# Patient Record
Sex: Female | Born: 1965 | Race: Black or African American | Hispanic: No | Marital: Single | Smoking: Current every day smoker
Health system: Southern US, Community
[De-identification: ages and names within clinical notes are randomized; demographics above are authoritative.]

---

## 2000-02-08 ENCOUNTER — Encounter: Payer: Self-pay | Admitting: Emergency Medicine

## 2000-02-08 ENCOUNTER — Emergency Department (HOSPITAL_COMMUNITY): Admission: EM | Admit: 2000-02-08 | Discharge: 2000-02-08 | Payer: Self-pay | Admitting: Emergency Medicine

## 2005-12-22 ENCOUNTER — Observation Stay (HOSPITAL_COMMUNITY): Admission: EM | Admit: 2005-12-22 | Discharge: 2005-12-24 | Payer: Self-pay | Admitting: Emergency Medicine

## 2005-12-24 ENCOUNTER — Inpatient Hospital Stay (HOSPITAL_COMMUNITY): Admission: AD | Admit: 2005-12-24 | Discharge: 2005-12-27 | Payer: Self-pay | Admitting: Psychiatry

## 2005-12-24 ENCOUNTER — Ambulatory Visit: Payer: Self-pay | Admitting: Psychiatry

## 2006-06-20 ENCOUNTER — Ambulatory Visit: Payer: Self-pay | Admitting: Family Medicine

## 2006-07-15 ENCOUNTER — Ambulatory Visit: Payer: Self-pay | Admitting: Internal Medicine

## 2006-07-22 ENCOUNTER — Encounter: Admission: RE | Admit: 2006-07-22 | Discharge: 2006-07-22 | Payer: Self-pay | Admitting: Internal Medicine

## 2010-06-30 NOTE — Discharge Summary (Signed)
Priscilla Fisher, Priscilla Fisher               ACCOUNT NO.:  [id number]   MEDICAL RECORD NO.:  [id number]          PATIENT TYPE:  OBV   LOCATION:  2041                         FACILITY:  [facility]   PHYSICIAN:  Hind I Elsaid, MD      DATE OF BIRTH:  15-Oct-1965   DATE OF ADMISSION:  12/22/2005  DATE OF DISCHARGE:  12/24/2005                               DISCHARGE SUMMARY   PRIMARY CARE PHYSICIAN:  Unassigned.   DISCHARGE DIAGNOSES:  1. Overdose of Ativan in suicidal attempt.  2. Depression.   DISCHARGE MEDICATIONS:  None.   CONSULTATIONS:  Psych consult by Dr. Antonietta Breach  for a suicidal  attempt and depression.  The patient will be transferred to psych floor  for more adjustment of the psychiatric medications and for more  psychotherapy.   HOSPITAL COURSE:  The patient was admitted on December 22, 2005, with a  history of Ativan overdose and the patient has been following under  steady observation for her vital signs.  Was started on normal saline,  also the patient was started on IV fluid for hydration.  Continual  checking up of the pulse oximetry was continuous with not any sign of  respiratory depression or mental depression.  The patient was willing to  give a full history of what exactly happened during hospitalization,  during her further attempt.  So, the patient is medically cleared to be  transferred to a psych floor and to follow with Dr. Antonietta Breach.  The patient also denies any history of HIV and she is willing to do an  HIV test and counseling.   CONDITION ON DISCHARGE:  The patient was in good condition and is stable  to be discharged.  The patient is stable to be transferred to the psych  floor.   PHYSICAL EXAMINATION:  On discharge, the patient's vital signs:  Temperature is 98.6, pulse is 61, respiratory rate is 24, blood pressure  is 101/66, and she is saturating 94-100% on room air.  From a medical  standpoint of view, the patient is medically stable to transfer  to  psych.  The patient is also to follow with the primary care for further  testing.      Hind Bosie Helper, MD  Electronically Signed     HIE/MEDQ  D:  12/24/2005  T:  12/25/2005  Job:  [id number]

## 2010-06-30 NOTE — Discharge Summary (Signed)
NAMEGERI, HEPLER NO.:  [id number]   MEDICAL RECORD NO.:  [id number]          PATIENT TYPE:  IPS   LOCATION:  0302                          [facility]   PHYSICIAN:  Anselm Jungling, MD  DATE OF BIRTH:  08/25/1965   DATE OF ADMISSION:  12/24/2005  DATE OF DISCHARGE:  12/27/2005                                 DISCHARGE SUMMARY   IDENTIFYING DATA/REASON FOR ADMISSION:  This was the first [facility] admission for  [facility], a 45 year old single African American female who was transferred to  [facility] from [facility] where she had been treated for an overdose which  was a suicide attempt that occurred on or about December 21, 2005. The  patient gave [facility] a history of being alcohol dependent, but had quit alcohol  one year ago. She had not been involved in any current treatment for  depression. This was her first ever suicide attempt. The precipitant for  this apparently had been an argument with her girlfriend and significant  other, [facility]. The undersigned had seen the patient in psychiatric  consultation while the patient was still being treated for her overdose at  [facility] on December 23, 2005. Please refer to the admission note  for further details pertaining to the symptoms, circumstances, and history  that led to her hospitalization. She was given an initial Axis I diagnosis  of major depressive disorder and alcohol dependence in remission.   MEDICAL AND LABORATORY:  The patient was medically treated for her overdose  at [facility], and then upon admission to the psychiatric inpatient  service was medically and physically assessed by the psychiatric nurse  practitioner. She presented as a very slender but essentially healthy-  appearing woman without any significant active or chronic medical problems.  There were no acute medical issues.   [facility] COURSE:  The patient was admitted to the adult inpatient  psychiatric service. She presented  as a petite, thin African-American female  who was alert, fully oriented, well-groomed, up and active in the milieu.  She was articulate and open. Her thoughts and speech were normally  organized. There was nothing to suggest any underlying psychosis or thought  disorder. Her mood was moderately depressed with sad affect. She denied  suicidal ideation and indicated that she regretted her overdose which she  described as being something that she did to get her girlfriend's attention.  She was open to treatment for depression. We discussed a trial of  antidepressant medication and began Zoloft 25 mg daily. She participated in  various therapeutic groups and activities and was a good participant in the  milieu program.   On the second Fisher day there was a family counseling session involving  the patient and her significant other, Priscilla Fisher. They were able to discuss  issues between them calmly and thoughtfully and arrived at agreement on  returning to live together after [facility] is discharged.   By the fourth Fisher day the patient had been absent any suicidal ideation  and was feeling much better. She felt ready to return home and continue  treatment in the outpatient setting.   AFTERCARE:  The patient was to follow up with Dr. Joni Reining at the [facility] on January 01, 2006, and was also to present at [facility] of  the [facility] for individual counseling.   DISCHARGE MEDICATIONS:  Zoloft 50 mg daily.   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   AXIS II:  Deferred.   AXIS III:  No acute or chronic illnesses.   AXIS IV:  Stressors severe.   AXIS V:  GAF on discharge 70.      Anselm Jungling, MD  Electronically Signed     SPB/MEDQ  D:  12/28/2005  T:  12/28/2005  Job:  [id number]

## 2010-06-30 NOTE — H&P (Signed)
NAMEEXIE, CHRISMER NO.:  [id number]   MEDICAL RECORD NO.:  [id number]          PATIENT TYPE:  EMS   LOCATION:  MAJO                         FACILITY:  [facility]   PHYSICIAN:  Theresia Bough, MD       DATE OF BIRTH:  06/30/1965   DATE OF ADMISSION:  12/22/2005  DATE OF DISCHARGE:                                HISTORY & PHYSICAL   PRESENTING COMPLAINT:  Patient was brought into the hospital with a  complaint of overdose.   HISTORY OF PRESENT ILLNESS:  This is a 45 year old African-American female  patient who came into the hospital today because she took an overdose of  Ativan.  Patient took an unknown amount of Ativan pills.  She took it at  about 9:00 a.m. today.  This was a suicide attempt.  At the time of this  examination, patient has been sleeping.  She was not able to give me any  good history.  I relied on the history recorded by the ED physician; I also  relied on the nurse's record as well as the lab and chest x-ray findings.  The reason for the ingestion was a suicide attempt.  Patient was said to  have no significant past medical history.  Patient is not able to answer to  any review of systems at this time.   PAST MEDICAL HISTORY:  Not significant.   SOCIAL HISTORY:  Patient is a nonsmoker.  I am not sure if she drinks  alcohol.  I do not know if she has a history of drug abuse.   FAMILY HISTORY:  Not obtainable at this time.   HOME MEDICATIONS:  Patient was not on any medications.  Patient has no known  drug allergies.   REVIEW OF SYSTEMS:  Patient's review of systems documented by the ED was  negative.   PHYSICAL EXAMINATION:  VITAL SIGNS:  Blood pressure 107/73 with a pulse rate  of 74, respirations of 18, temperature of 95.9.  HEAD/NECK:  Show pink conjunctivae.  She has no jaundice.  Neck is supple.  Her mucous membrane is moist.  CHEST:  Clear to auscultation bilaterally.  No crackles.  There are no  rales.  There is no wheezing.  CARDIOVASCULAR:  First and second S sounds, no murmur, no gallop and no rub.  ABDOMEN:  Soft and nontender, patient has normal bowel sounds.  There are no  masses palpable.  EXTREMITIES:  Show no edema.  No deformity.  CENTRAL NERVOUS SYSTEM:  Patient is arousable and responds to __________, a  little drowsy at this time and she went back to sleep after I tried to wake  her up.  SKIN:  Her skin color is normal, there are no rashes.   Initial chest x-ray shows no acute abnormalities.   Initial EKG shows sinus bradycardia, no active STT changes.   Sodium level is 139, potassium 4.0, chloride of 105, bicarb of 25, BUN of  18, creatinine of 0.7 and glucose of 85.  WBC 11.4 which is slightly  elevated, hemoglobin 14.1, hematocrit 42.7, platelets of 319.  Drug screen  is positive for benzodiazepine.  Drug screen is also positive for  tetrahydrocannabinoid.  Urinalysis shows no infection.  Tylenol level is  less than 10, INR is 1.0, PT is __________, alcohol is less than 5,  salicylate is less than 4 which are all normal.   ASSESSMENT:  1. This was a benzodiazepine overdose.  2. Possible suicide intention.   Our plan is to admit the patient to telemetry observation for monitoring.  Patient is going to have IV fluids with normal saline at 70 cc per hour and  then we are going to check her pulse oximetry continuously.  She is going to  be a fall precaution.  Patient is going to be seen by behavioral health in  a.m. and also she is going to have substance abuse counseling because of the  tetrahydrocannabinoid that was seen in her urine drug screen.  Further  management of this patient will depend on any clinical symptoms that may  develop.  At this time, patient is sleeping.  She is saturating well with an  oxygen saturation about 95-to-99% on room air.  She will be on telemetry  monitoring.      Theresia Bough, MD  Electronically Signed     GA/MEDQ  D:  12/22/2005  T:  12/23/2005   Job:  [id number]

## 2013-10-31 ENCOUNTER — Emergency Department (HOSPITAL_COMMUNITY)
Admission: EM | Admit: 2013-10-31 | Discharge: 2013-10-31 | Disposition: A | Discharge status: Home / Self Care | Payer: No Typology Code available for payment source | Attending: Emergency Medicine | Admitting: Emergency Medicine

## 2013-10-31 ENCOUNTER — Encounter (HOSPITAL_COMMUNITY): Payer: Self-pay | Admitting: Emergency Medicine

## 2013-10-31 ENCOUNTER — Emergency Department (HOSPITAL_COMMUNITY): Payer: No Typology Code available for payment source

## 2013-10-31 DIAGNOSIS — IMO0002 Reserved for concepts with insufficient information to code with codable children: Secondary | ICD-10-CM | POA: Insufficient documentation

## 2013-10-31 DIAGNOSIS — Z880 Allergy status to penicillin: Secondary | ICD-10-CM | POA: Diagnosis not present

## 2013-10-31 DIAGNOSIS — Z7982 Long term (current) use of aspirin: Secondary | ICD-10-CM | POA: Diagnosis not present

## 2013-10-31 DIAGNOSIS — Y92410 Unspecified street and highway as the place of occurrence of the external cause: Secondary | ICD-10-CM | POA: Diagnosis not present

## 2013-10-31 DIAGNOSIS — F17200 Nicotine dependence, unspecified, uncomplicated: Secondary | ICD-10-CM | POA: Diagnosis not present

## 2013-10-31 DIAGNOSIS — Y9389 Activity, other specified: Secondary | ICD-10-CM | POA: Diagnosis not present

## 2013-10-31 DIAGNOSIS — M479 Spondylosis, unspecified: Secondary | ICD-10-CM

## 2013-10-31 MED ORDER — HYDROCODONE-ACETAMINOPHEN 5-325 MG PO TABS: 2.0000 | ORAL_TABLET | ORAL | Status: DC | PRN

## 2013-10-31 MED ORDER — IBUPROFEN 600 MG PO TABS: 600.0000 mg | ORAL_TABLET | Freq: Four times a day (QID) | ORAL | Status: DC | PRN

## 2013-10-31 MED ORDER — CYCLOBENZAPRINE HCL 10 MG PO TABS: 10.0000 mg | ORAL_TABLET | Freq: Two times a day (BID) | ORAL | Status: DC | PRN
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
## 2013-10-31 NOTE — Discharge Instructions (Signed)
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
## 2015-07-23 ENCOUNTER — Emergency Department (HOSPITAL_COMMUNITY)
Admission: EM | Admit: 2015-07-23 | Discharge: 2015-07-24 | Disposition: A | Discharge status: Other Acute Inpatient Hospital | Payer: No Typology Code available for payment source | Attending: Emergency Medicine | Admitting: Emergency Medicine

## 2015-07-23 ENCOUNTER — Encounter (HOSPITAL_COMMUNITY): Payer: Self-pay | Admitting: Emergency Medicine

## 2015-07-23 DIAGNOSIS — F17210 Nicotine dependence, cigarettes, uncomplicated: Secondary | ICD-10-CM | POA: Diagnosis present

## 2015-07-23 DIAGNOSIS — G4700 Insomnia, unspecified: Secondary | ICD-10-CM | POA: Diagnosis present

## 2015-07-23 DIAGNOSIS — X76XXXA Intentional self-harm by smoke, fire and flames, initial encounter: Secondary | ICD-10-CM | POA: Insufficient documentation

## 2015-07-23 DIAGNOSIS — F333 Major depressive disorder, recurrent, severe with psychotic symptoms: Principal | ICD-10-CM | POA: Diagnosis present

## 2015-07-23 DIAGNOSIS — T23151A Burn of first degree of right palm, initial encounter: Secondary | ICD-10-CM | POA: Insufficient documentation

## 2015-07-23 DIAGNOSIS — T2016XA Burn of first degree of forehead and cheek, initial encounter: Secondary | ICD-10-CM | POA: Insufficient documentation

## 2015-07-23 DIAGNOSIS — Y999 Unspecified external cause status: Secondary | ICD-10-CM | POA: Insufficient documentation

## 2015-07-23 DIAGNOSIS — R440 Auditory hallucinations: Secondary | ICD-10-CM | POA: Insufficient documentation

## 2015-07-23 DIAGNOSIS — F29 Unspecified psychosis not due to a substance or known physiological condition: Secondary | ICD-10-CM | POA: Diagnosis present

## 2015-07-23 DIAGNOSIS — Y929 Unspecified place or not applicable: Secondary | ICD-10-CM | POA: Insufficient documentation

## 2015-07-23 DIAGNOSIS — Y939 Activity, unspecified: Secondary | ICD-10-CM | POA: Insufficient documentation

## 2015-07-23 DIAGNOSIS — R45851 Suicidal ideations: Secondary | ICD-10-CM

## 2015-07-23 DIAGNOSIS — F17200 Nicotine dependence, unspecified, uncomplicated: Secondary | ICD-10-CM | POA: Insufficient documentation

## 2015-07-23 DIAGNOSIS — F1210 Cannabis abuse, uncomplicated: Secondary | ICD-10-CM | POA: Diagnosis present

## 2015-07-23 DIAGNOSIS — F411 Generalized anxiety disorder: Secondary | ICD-10-CM | POA: Diagnosis present

## 2015-07-23 LAB — COMPREHENSIVE METABOLIC PANEL
ALBUMIN: 4.6 g/dL (ref 3.5–5.0)
ALK PHOS: 84 U/L (ref 38–126)
ALT: 23 U/L (ref 14–54)
AST: 37 U/L (ref 15–41)
Anion gap: 9 (ref 5–15)
BUN: 9 mg/dL (ref 6–20)
CALCIUM: 9.8 mg/dL (ref 8.9–10.3)
CO2: 26 mmol/L (ref 22–32)
Chloride: 107 mmol/L (ref 101–111)
Creatinine, Ser: 0.77 mg/dL (ref 0.44–1.00)
Glucose, Bld: 119 mg/dL — ABNORMAL HIGH (ref 65–99)
Potassium: 3.1 mmol/L — ABNORMAL LOW (ref 3.5–5.1)
Sodium: 142 mmol/L (ref 135–145)
TOTAL PROTEIN: 7.9 g/dL (ref 6.5–8.1)
Total Bilirubin: 0.7 mg/dL (ref 0.3–1.2)

## 2015-07-23 LAB — URINE MICROSCOPIC-ADD ON

## 2015-07-23 LAB — RAPID URINE DRUG SCREEN, HOSP PERFORMED
AMPHETAMINES: NOT DETECTED
BENZODIAZEPINES: NOT DETECTED
Barbiturates: NOT DETECTED
Cocaine: NOT DETECTED
Opiates: NOT DETECTED
TETRAHYDROCANNABINOL: POSITIVE — AB

## 2015-07-23 LAB — URINALYSIS, ROUTINE W REFLEX MICROSCOPIC
BILIRUBIN URINE: NEGATIVE
Glucose, UA: NEGATIVE mg/dL
KETONES UR: NEGATIVE mg/dL
NITRITE: NEGATIVE
PH: 5.5 (ref 5.0–8.0)
Protein, ur: NEGATIVE mg/dL
SPECIFIC GRAVITY, URINE: 1.034 — AB (ref 1.005–1.030)

## 2015-07-23 LAB — CBC
HCT: 41.3 % (ref 36.0–46.0)
Hemoglobin: 13.8 g/dL (ref 12.0–15.0)
MCH: 28.1 pg (ref 26.0–34.0)
MCHC: 33.4 g/dL (ref 30.0–36.0)
MCV: 84.1 fL (ref 78.0–100.0)
Platelets: 329 10*3/uL (ref 150–400)
RBC: 4.91 MIL/uL (ref 3.87–5.11)
RDW: 14.3 % (ref 11.5–15.5)
WBC: 14 10*3/uL — ABNORMAL HIGH (ref 4.0–10.5)

## 2015-07-23 LAB — SALICYLATE LEVEL

## 2015-07-23 LAB — ACETAMINOPHEN LEVEL: Acetaminophen (Tylenol), Serum: <10 ug/mL — ABNORMAL LOW (ref 10–30)

## 2015-07-23 LAB — ETHANOL

## 2015-07-23 MED ORDER — TRAZODONE HCL 50 MG PO TABS
50.0000 mg | ORAL_TABLET | Freq: Every day | ORAL | Status: DC
  Filled 2015-07-23: qty 1
  Filled 2015-07-23: qty 7
  Filled 2015-07-23 (×4): qty 1
  Filled 2015-07-23: qty 7

## 2015-07-23 MED ORDER — LORAZEPAM 1 MG PO TABS: 1.0000 mg | ORAL_TABLET | Freq: Three times a day (TID) | ORAL | Status: DC | PRN

## 2015-07-23 MED ORDER — TRAZODONE HCL 50 MG PO TABS: 50.0000 mg | ORAL_TABLET | Freq: Every evening | ORAL | Status: DC | PRN

## 2015-07-23 MED ORDER — MAGNESIUM HYDROXIDE 400 MG/5ML PO SUSP
30.0000 mL | Freq: Every day | ORAL | Status: DC | PRN
  Filled 2015-07-23: qty 30

## 2015-07-23 MED ORDER — OLANZAPINE 2.5 MG PO TABS
2.5000 mg | ORAL_TABLET | Freq: Every day | ORAL | Status: DC
  Administered 2015-07-23: 2.5 mg via ORAL
  Filled 2015-07-23: qty 1

## 2015-07-23 MED ORDER — ZOLPIDEM TARTRATE 5 MG PO TABS: 5.0000 mg | ORAL_TABLET | Freq: Every evening | ORAL | Status: DC | PRN

## 2015-07-23 MED ORDER — ALUM & MAG HYDROXIDE-SIMETH 200-200-20 MG/5ML PO SUSP: 30.0000 mL | ORAL | Status: DC | PRN

## 2015-07-23 MED ORDER — LORAZEPAM 1 MG PO TABS: 0.0000 mg | ORAL_TABLET | Freq: Two times a day (BID) | ORAL | Status: DC

## 2015-07-23 MED ORDER — POTASSIUM CHLORIDE CRYS ER 20 MEQ PO TBCR
40.0000 meq | EXTENDED_RELEASE_TABLET | Freq: Once | ORAL | Status: AC
  Administered 2015-07-23: 40 meq via ORAL
  Filled 2015-07-23: qty 2

## 2015-07-23 MED ORDER — LORAZEPAM 1 MG PO TABS: 0.0000 mg | ORAL_TABLET | Freq: Four times a day (QID) | ORAL | Status: DC

## 2015-07-23 MED ORDER — VITAMIN B-1 100 MG PO TABS
100.0000 mg | ORAL_TABLET | Freq: Every day | ORAL | Status: DC
  Administered 2015-07-23: 100 mg via ORAL
  Filled 2015-07-23: qty 1

## 2015-07-23 MED ORDER — ALUM & MAG HYDROXIDE-SIMETH 200-200-20 MG/5ML PO SUSP
30.0000 mL | ORAL | Status: DC | PRN
  Filled 2015-07-23: qty 30

## 2015-07-23 MED ORDER — ONDANSETRON HCL 4 MG PO TABS: 4.0000 mg | ORAL_TABLET | Freq: Three times a day (TID) | ORAL | Status: DC | PRN

## 2015-07-23 MED ORDER — ACETAMINOPHEN 325 MG PO TABS
650.0000 mg | ORAL_TABLET | Freq: Four times a day (QID) | ORAL | Status: DC | PRN
  Filled 2015-07-23: qty 2

## 2015-07-23 MED ORDER — THIAMINE HCL 100 MG/ML IJ SOLN: 100.0000 mg | Freq: Every day | INTRAMUSCULAR | Status: DC

## 2015-07-23 MED ORDER — ACETAMINOPHEN 325 MG PO TABS: 650.0000 mg | ORAL_TABLET | ORAL | Status: DC | PRN

## 2015-07-23 MED ORDER — NICOTINE 21 MG/24HR TD PT24: 21.0000 mg | MEDICATED_PATCH | Freq: Every day | TRANSDERMAL | Status: DC

## 2015-07-23 MED ORDER — IBUPROFEN 200 MG PO TABS: 600.0000 mg | ORAL_TABLET | Freq: Three times a day (TID) | ORAL | Status: DC | PRN

## 2015-07-23 NOTE — Progress Notes (Signed)
[facility]  per Dr. Abelina BachelorEppen.  Call report to [phone].  Call for admission ETA after shift change.      Maryelizabeth Rowanressa Inita Uram, MSW, Clare CharonLCSW, LCAS [facility] Triage Specialist [phone]
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
## 2015-07-23 NOTE — ED Notes (Signed)
Pair of shoes, white socks, blue jeans, white shirt, key, pair of earrings, White sports bra

## 2015-07-23 NOTE — ED Notes (Signed)
Patient noted in room talking to NP. No complaints, stable, in no acute distress. Q15 minute rounds and monitoring via [employer] to continue.

## 2015-07-23 NOTE — ED Notes (Signed)
First attempt made to try and call report. Nurse currently busy, she requested I try again between half and hour and an hour from now.

## 2015-07-23 NOTE — ED Notes (Signed)
Up tot he bathroom to shower and change scrubs 

## 2015-07-23 NOTE — ED Notes (Signed)
Report from Janie Rambo RN. Patient sleeping, respirations regular and unlabored. Q15 minute rounds and security camera observation to continue.   

## 2015-07-23 NOTE — ED Notes (Addendum)
Pt reports that she drinks 2 beers a day and denies any s/s of withdrawal at this time, and has not had any in the previously.

## 2015-07-23 NOTE — [facility] Counselor (Signed)
[facility] and can be transported once medically cleared. Please call report before sending- report number for adult unit [id number].   Kateri PlummerKristin Malanie Koloski, M.S., LPCA, [place], [facility] Licensed Professional Counselor Associate  Triage Specialist  [facility]  Therapeutic Triage Services Phone: [phone] Fax: [phone]
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
## 2015-07-23 NOTE — ED Notes (Signed)
Patient noted sleeping in room. No complaints, stable, in no acute distress. Q15 minute rounds and monitoring via Security Cameras to continue.  

## 2015-07-24 ENCOUNTER — Encounter (HOSPITAL_COMMUNITY): Payer: Self-pay

## 2015-07-24 ENCOUNTER — Inpatient Hospital Stay (HOSPITAL_COMMUNITY)
Admission: AD | Admit: 2015-07-24 | Discharge: 2015-07-25 | DRG: 885 | Disposition: A | Discharge status: Home / Self Care | Payer: No Typology Code available for payment source | Attending: Psychiatry | Admitting: Psychiatry

## 2015-07-24 DIAGNOSIS — F333 Major depressive disorder, recurrent, severe with psychotic symptoms: Secondary | ICD-10-CM

## 2015-07-24 DIAGNOSIS — F411 Generalized anxiety disorder: Secondary | ICD-10-CM | POA: Diagnosis present

## 2015-07-24 DIAGNOSIS — F323 Major depressive disorder, single episode, severe with psychotic features: Secondary | ICD-10-CM | POA: Clinically undetermined

## 2015-07-24 DIAGNOSIS — F29 Unspecified psychosis not due to a substance or known physiological condition: Secondary | ICD-10-CM | POA: Diagnosis present

## 2015-07-24 DIAGNOSIS — G4700 Insomnia, unspecified: Secondary | ICD-10-CM | POA: Diagnosis present

## 2015-07-24 DIAGNOSIS — F17210 Nicotine dependence, cigarettes, uncomplicated: Secondary | ICD-10-CM | POA: Diagnosis present

## 2015-07-24 DIAGNOSIS — F1210 Cannabis abuse, uncomplicated: Secondary | ICD-10-CM | POA: Clinically undetermined

## 2015-07-24 LAB — LIPID PANEL
CHOL/HDL RATIO: 3.1 ratio
CHOLESTEROL: 200 mg/dL (ref 0–200)
HDL: 64 mg/dL (ref >40)
LDL CALC: 101 mg/dL — AB (ref 0–99)
TRIGLYCERIDES: 176 mg/dL — AB (ref <150)
VLDL: 35 mg/dL (ref 0–40)

## 2015-07-24 LAB — TSH: TSH: 1.767 u[IU]/mL (ref 0.350–4.500)

## 2015-07-24 LAB — PREGNANCY, URINE: Preg Test, Ur: NEGATIVE

## 2015-07-24 MED ORDER — HYDROXYZINE HCL 25 MG PO TABS
25.0000 mg | ORAL_TABLET | ORAL | Status: DC | PRN
  Filled 2015-07-24: qty 10
  Filled 2015-07-24 (×2): qty 1

## 2015-07-24 MED ORDER — NICOTINE 21 MG/24HR TD PT24
21.0000 mg | MEDICATED_PATCH | Freq: Every day | TRANSDERMAL | Status: DC
  Administered 2015-07-25: 21 mg via TRANSDERMAL
  Filled 2015-07-24 (×4): qty 1

## 2015-07-24 MED ORDER — ARIPIPRAZOLE 5 MG PO TABS
5.0000 mg | ORAL_TABLET | Freq: Every day | ORAL | Status: DC
  Administered 2015-07-24 – 2015-07-25 (×2): 5 mg via ORAL
  Filled 2015-07-24: qty 1
  Filled 2015-07-24: qty 7
  Filled 2015-07-24 (×3): qty 1
  Filled 2015-07-24: qty 7

## 2015-07-24 NOTE — Tx Team (Signed)
Initial Interdisciplinary Treatment Plan   PATIENT STRESSORS: Financial difficulties Health problems   PATIENT STRENGTHS: Ability for insight Active sense of humor Average or above average intelligence General fund of knowledge Motivation for treatment/growth   PROBLEM LIST: Problem List/Patient Goals Date to be addressed Date deferred Reason deferred Estimated date of resolution  depression 07/24/2015     anxiety 07/24/2015     Self harm behaviour                         07/24/2015     "get some sleep" 07/24/2015     Risk for suicide 07/24/2015     Feeling overworked 07/24/2015                        DISCHARGE CRITERIA:  Ability to meet basic life and health needs Improved stabilization in mood, thinking, and/or behavior Motivation to continue treatment in a less acute level of care Need for constant or close observation no longer present Verbal commitment to aftercare and medication compliance  PRELIMINARY DISCHARGE PLAN: Attend aftercare/continuing care group Return to previous work or school arrangements  PATIENT/FAMIILY INVOLVEMENT: This treatment plan has been presented to and reviewed with the patient, Talmadge ChadGloria M Terriquez, and The patient and family have been given the opportunity to ask questions and make suggestions.  JEHU-APPIAH, Calle Schader K 07/24/2015, 2:31 AM
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
## 2015-07-24 NOTE — Progress Notes (Signed)
Nutrition Brief Note  Patient identified on the Malnutrition Screening Tool (MST) Report  Wt Readings from Last 15 Encounters:  07/24/15 133 lb (60.328 kg)    Body mass index is 20.23 kg/(m^2). Patient meets criteria for normal range based on current BMI.   Diet Order: Diet regular Room service appropriate?: Yes; Fluid consistency:: Thin Pt is also offered choice of unit snacks mid-morning and mid-afternoon.   Labs and medications reviewed.   No nutrition interventions warranted at this time. If nutrition issues arise, please consult RD.   Priscilla FrancoLindsey Yaslyn Cumby, MS, RD, LDN Pager: [phone] After Hours Pager: [phone]

## 2015-07-24 NOTE — H&P (Signed)
Psychiatric Admission Assessment Adult  Patient Identification: Priscilla Fisher  MRN:  [id number]  Date of Evaluation:  07/24/2015  Chief Complaint:  MDD with psychotic features  Principal Diagnosis: Severe recurrent major depressive disorder with psychotic features [facility](HCC)  Diagnosis:   Patient Active Problem List   Diagnosis Date Noted  . Psychosis [F29] 07/23/2015   History of Present Illness: This is an admission assessment for Priscilla Fisher, a 50 year old African-American female. [facility]. She came to the hospital with a complaint of having auditory hallucinations that were commanding in nature, telling her to do things. Apparently, this has gone x 2 weeks. During this admission assessment, Priscilla Fisher reports, "I'm not well. I came to the hospital because there is something wrong with me. There is something going on with me that I do not understand. I'm stressed from alright. I work too much. The stress is from work. I have so much to do at work. Something is definitely wrong with me & I'm scared to say it. It has been going for a while, may 2 weeks or more. There is a Ambulance persondemon in me. I think I did something really bad a couple of days ago. I cannot say what it is, I'm sorry.  I need a preacher, yes, that is what I need. I have been treated for depression a long time ago with Zoloft. I did not do well on Zoloft because, it made me mean".  Objective: Priscilla Fisher is seen, chart reviewed. She is alert alert, oriented x 3 & aware of situation. When entering her room to do this assessment, Priscilla Fisher was observed sitting quietly on her bed facing the wall while backing the door. She presents with a restricted affect, barely making eye contact. She is verbally responsive, but not spontaneous. She appears to be responding to some internal stimuli. She presents as guarded or could be thought blocking. She did admit to something being wrong with her,  however, seem uncomfortable or rather scared to say what it. She did make a comment that she did not want to say what she believed was wrong with her or that she did wrong for fear of being locked up. She requested to see a preacher. She is reassured & encouraged to participate in the group milieu.  Associated Signs/Symptoms:  Depression Symptoms:  insomnia, feelings of worthlessness/guilt, difficulty concentrating, anxiety, Concerns  (Hypo) Manic Symptoms:  Hallucinations,  Anxiety Symptoms:  Excessive Worry,  Psychotic Symptoms:  Hallucinations: Auditory  PTSD Symptoms: Denies any symptoms  Total Time spent with patient: 1 hour  Past Psychiatric History: Major depression.  Is the patient at risk to self? Yes.    Has the patient been a risk to self in the past 6 months? No.  Has the patient been a risk to self within the distant past? No.  Is the patient a risk to others? No.  Has the patient been a risk to others in the past 6 months? No.  Has the patient been a risk to others within the distant past? No.   Prior Inpatient Therapy: Prior Inpatient Therapy: Yes Prior Therapy Dates: 2007 [facility] Reason for Treatment: SI attempt Prior Outpatient Therapy: Prior Outpatient Therapy: No Does patient have an ACCT team?: No Does patient have Intensive In-House Services?  : No Does patient have [facility] services? : No Does patient have P4CC services?: No  Alcohol Screening: 1. How often do you  have a drink containing alcohol?: 2 to 4 times a month 2. How many drinks containing alcohol do you have on a typical day when you are drinking?: 1 or 2 3. How often do you have six or more drinks on one occasion?: Never Preliminary Score: 0 9. Have you or someone else been injured as a result of your drinking?: No 10. Has a relative or friend or a doctor or another health worker been concerned about your drinking or suggested you cut down?: No Alcohol Use  Disorder Identification Test Final Score (AUDIT): 2 Brief Intervention: AUDIT score less than 7 or less-screening does not suggest unhealthy drinking-brief intervention not indicated  Substance Abuse History in the last 12 months:  Yes.    Consequences of Substance Abuse: Medical Consequences:  Liver damage, Possible death by overdose Legal Consequences:  Arrests, jail time, Loss of driving privilege. Family Consequences:  Family discord, divorce and or separation.  Previous Psychotropic Medications: Yes, Sertraline  Psychological Evaluations: Yes   Past Medical History: History reviewed. No pertinent past medical history. History reviewed. No pertinent past surgical history.  Family History: History reviewed. No pertinent family history.  Family Psychiatric  History: "I don't know, I was adopted"  Tobacco Screening: Smokes a pack of cigarettes daily.  Social History:  History  Alcohol Use  . 1.2 oz/week  . 2 Cans of beer per week    Comment: occasional     History  Drug Use  . Yes  . Special: Marijuana    Additional Social History: Marital status: Single    History of alcohol / drug use?: Yes Name of Substance 1: Marijuana  1 - Age of First Use: Unspecified 1 - Amount (size/oz): uses small amounts throughout the week to "unwind" 1 - Last Use / Amount: last use 2 weeks ago  Name of Substance 2: Social drinker of alcohol- no abuse specified   Allergies:   Allergies  Allergen Reactions  . Penicillins Other (See Comments)    Unknown Has patient had a PCN reaction causing immediate rash, facial/tongue/throat swelling, SOB or lightheadedness with hypotension: Unknown Has patient had a PCN reaction causing severe rash involving mucus membranes or skin necrosis: Unknown Has patient had a PCN reaction that required hospitalization Unknown Has patient had a PCN reaction occurring within the last 10 years: No If all of the above answers are "NO", then may proceed with  Cephalosporin use.    Lab Results:  Results for orders placed or performed during the hospital encounter of 07/24/15 (from the past 48 hour(s))  TSH     Status: None   Collection Time: 07/24/15  6:26 AM  Result Value Ref Range   TSH 1.767 0.350 - 4.500 uIU/mL    Comment: Performed at [facility]   Blood Alcohol level:  Lab Results  Component Value Date   [facility] <5 07/23/2015   Metabolic Disorder Labs:  No results found for: HGBA1C, MPG No results found for: PROLACTIN No results found for: CHOL, TRIG, HDL, CHOLHDL, VLDL, LDLCALC  Current Medications: Current Facility-Administered Medications  Medication Dose Route Frequency Provider Last Rate Last Dose  . acetaminophen (TYLENOL) tablet 650 mg  650 mg Oral Q6H PRN Sanjuana Kava, NP      . alum & mag hydroxide-simeth (MAALOX/MYLANTA) 200-200-20 MG/5ML suspension 30 mL  30 mL Oral Q4H PRN Sanjuana Kava, NP      . magnesium hydroxide (MILK OF MAGNESIA) suspension 30 mL  30 mL Oral Daily PRN  Sanjuana Kava, NP      . traZODone (DESYREL) tablet 50 mg  50 mg Oral QHS Sanjuana Kava, NP   50 mg at 07/24/15 0200   PTA Medications: No prescriptions prior to admission   Musculoskeletal: Strength & Muscle Tone: within normal limits Gait & Station: normal Patient leans: N/A  Psychiatric Specialty Exam: Physical Exam  Constitutional: She is oriented to person, place, and time. She appears well-developed and well-nourished.  HENT:  Head: Normocephalic.  Eyes: Pupils are equal, round, and reactive to light.  Neck: Normal range of motion.  Cardiovascular: Normal rate.   Respiratory: Effort normal.  GI: Soft.  Genitourinary:  Denies any issues in this area  Musculoskeletal: Normal range of motion.  Neurological: She is alert and oriented to person, place, and time.  Skin: Skin is warm and dry.  Psychiatric: Her speech is normal. Her mood appears anxious. Her affect is not angry, not blunt, not labile and not  inappropriate. She is actively hallucinating (Auditory). Thought content is delusional. Cognition and memory are normal. She expresses impulsivity. She exhibits a depressed mood.    Review of Systems  Constitutional: Negative.   HENT: Negative.   Eyes: Negative.   Respiratory: Negative.   Cardiovascular: Negative.   Gastrointestinal: Negative.   Genitourinary: Negative.   Musculoskeletal: Negative.   Skin: Negative.   Neurological: Negative.   Endo/Heme/Allergies: Negative.   Psychiatric/Behavioral: Positive for depression, hallucinations (Auditory hallucination) and substance abuse (Cannabis abuse). Negative for memory loss. The patient is nervous/anxious and has insomnia.     Blood pressure 115/78, pulse 69, temperature 98 F (36.7 C), temperature source Oral, resp. rate 18, height 5\' 8"  (1.727 m), weight 60.328 kg (133 lb).Body mass index is 20.23 kg/(m^2).  General Appearance: Casual, guarded, almost secretive  Eye Contact:  Fair  Speech:  Clear, coherent, not spontaneous  Volume:  Decreased  Mood:  Anxious and Concern  Affect:  Congruent and Restricted  Thought Process:  Coherent  Orientation:  Full (Time, Place, and Person)  Thought Content:  Hallucinations: Auditory  Suicidal Thoughts:  Denies  Homicidal Thoughts:  Denies  Memory:  Immediate;   Good Recent;   Good Remote;   Good  Judgement:  Fair  Insight:  Fair  Psychomotor Activity:  Decreased  Concentration:  Concentration: Fair and Attention Span: Fair  Recall:  Good  Fund of Knowledge:  Fair  Language:  Good  Akathisia:  Negative  Handed:  Right  AIMS (if indicated):     Assets:  Communication Skills Desire for Improvement Physical Health  ADL's:  Intact  Cognition:  WNL  Sleep:      Treatment Plan/Recommendations: 1. Admit for crisis management and stabilization, estimated length of stay 3-5 days.  2. Medication management to reduce current symptoms to base line and improve the patient's overall level of  functioning: Abilify 5 mg for mood control, Trazodone 50 mg for insomnia, Hydroxyzine 25 mg for depression.  3. Treat health problems as indicated.  4. Develop treatment plan to decrease risk of relapse upon discharge and the need for readmission.  5. Psycho-social education regarding relapse prevention and self care.  6. Health care follow up as needed for medical problems.  7. Review, reconcile, and reinstate any pertinent home medications for other health issues where appropriate. 8. Call for consults with hospitalist for any additional specialty patient care services as needed.  Observation Level/Precautions:  15 minute checks  Laboratory:  Per ED, UDS positive for [facility]  Psychotherapy: Group  sessions    Medications: Abilify 5 mg for mood control, Trazodone 50 mg for insomnia, Hydroxyzine 25 mg for depression.   Consultations: As needed  Discharge Concerns: Safety, mood stability  Estimated LOS: 3-5 days  Other: Admit to 400-hall   I certify that inpatient services furnished can reasonably be expected to improve the patient's condition.    Sanjuana Kava, NP, PMHNP, FNP-BC 6/11/20179:19 AM  Patient seen face-to-face for this evaluation, case discussed with the treatment team, physician extender, completed admission suicide risk assessment and made treatment plan. Reviewed the information documented and agree with the treatment plan.   Dondre Catalfamo [facility] 07/24/2015 3:35 PM

## 2015-07-24 NOTE — [facility] Suicide Risk Assessment (Signed)
[facility] Admission Suicide Risk Assessment   Nursing information obtained from:  Patient, Review of record Demographic factors:  Living alone Current Mental Status:  Self-harm thoughts Loss Factors:  Financial problems / change in socioeconomic status Historical Factors:  NA Risk Reduction Factors:  Employed, Positive social support  Total Time spent with patient: 1 hour Principal Problem: Severe recurrent major depressive disorder with psychotic features (HCC) Diagnosis:   Patient Active Problem List   Diagnosis Date Noted  . Severe recurrent major depressive disorder with psychotic features (HCC) [F33.3] 07/24/2015  . Psychosis [F29] 07/23/2015   Subjective Data: Patient with increased depression with psychosis and unable to sleep and has bizarre behaviors. She needs crisis management and safety monitoring.   Continued Clinical Symptoms:  Alcohol Use Disorder Identification Test Final Score (AUDIT): 2 The "Alcohol Use Disorders Identification Test", Guidelines for Use in Primary Care, Second Edition.  World Science writer [facility]). Score between 0-7:  no or low risk or alcohol related problems. Score between 8-15:  moderate risk of alcohol related problems. Score between 16-19:  high risk of alcohol related problems. Score 20 or above:  warrants further diagnostic evaluation for alcohol dependence and treatment.   CLINICAL FACTORS:   Severe Anxiety and/or Agitation Bipolar Disorder:   Depressive phase Depression:   Comorbid alcohol abuse/dependence Delusional Hopelessness Impulsivity Insomnia Recent sense of peace/wellbeing Severe Alcohol/Substance Abuse/Dependencies More than one psychiatric diagnosis Currently Psychotic Previous Psychiatric Diagnoses and Treatments   Musculoskeletal: Strength & Muscle Tone: within normal limits Gait & Station: normal Patient leans: Right  Psychiatric Specialty Exam: Physical Exam Full physical performed in Emergency Department. I  have reviewed this assessment and concur with its findings.   ROS depression, hallucinations, bizarre behaviors, decreased need for sleep and increased goal directed activity. Concern about safety and paranoid delusions.  No Fever-chills, No Headache, No changes with Vision or hearing, reports vertigo No problems swallowing food or Liquids, No Chest pain, Cough or Shortness of Breath, No Abdominal pain, No Nausea or Vommitting, Bowel movements are regular, No Blood in stool or Urine, No dysuria, No new skin rashes or bruises, No new joints pains-aches,  No new weakness, tingling, numbness in any extremity, No recent weight gain or loss, No polyuria, polydypsia or polyphagia,   A full 10 point Review of Systems was done, except as stated above, all other Review of Systems were negative.  Blood pressure 115/78, pulse 69, temperature 98 F (36.7 C), temperature source Oral, resp. rate 18, height  (1.727 m), weight 60.328 kg (133 lb).Body mass index is 20.23 kg/(m^2).  General Appearance: Bizarre and Guarded  Eye Contact:  Fair  Speech:  Clear and Coherent  Volume:  Normal  Mood:  Anxious and Depressed  Affect:  Inappropriate and Labile  Thought Process:  Coherent and Goal Directed  Orientation:  Full (Time, Place, and Person)  Thought Content:  WDL  Suicidal Thoughts:  Yes.  without intent/plan  Homicidal Thoughts:  No  Memory:  Immediate;   Fair Recent;   Fair  Judgement:  Impaired  Insight:  Fair  Psychomotor Activity:  Restlessness  Concentration:  Concentration: Fair and Attention Span: Fair  Recall:  [employer] of Knowledge:  Good  Language:  Good  Akathisia:  Negative  Handed:  Right  AIMS (if indicated):     Assets:  Communication Skills Desire for Improvement Financial Resources/Insurance Housing Leisure Time Physical Health Resilience Social Support Talents/Skills Transportation Vocational/Educational  ADL's:  Intact  Cognition:  WNL  Sleep:  COGNITIVE FEATURES THAT CONTRIBUTE TO RISK:  Closed-mindedness, Loss of executive function and Polarized thinking    SUICIDE RISK:   Mild:  Suicidal ideation of limited frequency, intensity, duration, and specificity.  There are no identifiable plans, no associated intent, mild dysphoria and related symptoms, good self-control (both objective and subjective assessment), few other risk factors, and identifiable protective factors, including available and accessible social support.  PLAN OF CARE: Admit for recent onset of depression with psychosis and bizarre behavior and unable to care for herself. She needs in patient treatment for crisis stabilization and medication management of bipolar, severe depression with psychosis.   I certify that inpatient services furnished can reasonably be expected to improve the patient's condition.   Leata MouseJANARDHANA Therasa Lorenzi, MD 07/24/2015, 12:57 PM

## 2015-07-24 NOTE — ED Notes (Signed)
Patient noted in room. No complaints, stable, in no acute distress. Q15 minute rounds and monitoring via Security Cameras to continue.  

## 2015-07-24 NOTE — ED Notes (Signed)
Patient noted sleeping in room. No complaints, stable, in no acute distress. Q15 minute rounds and monitoring via Security Cameras to continue.  

## 2015-07-24 NOTE — Progress Notes (Signed)
D-pt sts that shes never had problems sleeping so she did not take the trazadone tonight, pt was teary eyed in group and sts "life is hard, but ill be fine.  i just need to be grateful and suck it up.  im really overwhelmed but much better off than most people; im gonna be all right" A-pt attended group & took her medications except for the trazadone R-cont to monitor for safety

## 2015-07-24 NOTE — Progress Notes (Signed)
Chaplain paged to assist pt with spiritual pain she is experiencing concerning a private event she feels has set her at odds with God. She did not reveal the details of the event, and was not pressed to reveal any more than she felt safe in revealing. Her wish was to be blessed with water, a baptism in view of her repentance of doing what she did, and have Psalm 23 read to her and explained. Earlier she had mentioned to her nurse that she wished Psalm 28 read, but when asked if she wished that verse read also she declined. Water from the staff was secured and pt was blessed with water. Pt cried during the event and thanked the chaplain for the ritual. Psalm 23 was read and a short discussion of the love of God followed.   Pt is sorry that the event happened in her life. It is unclear if this is something that happened to her or if it is something that entrapped her which she feels sorry for being a part of. Her spiritual pain derived from her feeling she is out of God's favor seemed at the moment to be lessened by the blessing and prayer. Pt stated that she trusted the chaplain and that her not revealing everything was not a reflection on him or the [facility]. She stated she is getting used to feeling safe [facility] and that the staff has done much to help her feel safe.  Recommend follow-up visit by staff chaplain to ensure continued spiritual care for this pt.  Benjie Karvonenharles D. Esther Broyles, DMin. MDiv Chaplain

## 2015-07-24 NOTE — Progress Notes (Signed)
Patient ID: Priscilla Fisher, female   DOB: 1965-10-17, 50 y.o.   MRN: [id number]  Pt currently presents with a flat affect and anxious behavior. Pt presents as scared, is tearful when talking about what [facility]. Pt reports "I don't feel right in my heart, I know something is wrong. I would like to be read Psalms 28 and to be blessed with water." Pt reports that she has been sleeping and then reports she has not been sleeping to Clinical research associatewriter. Pt looking around room during interaction, responding to internal stimuli. Some confusion present.   Pt provided with medications per providers orders. Pt's labs and vitals were monitored throughout the day. Pt supported emotionally and encouraged to express concerns and questions. Pt educated on medications and safety precautions. Chaplain contacted for consult per pt request.   Pt's safety ensured with 15 minute and environmental checks. Pt currently denies SI/HI and A/V hallucinations. Even though pt is responding to internal stimuli. Pt verbally agrees to seek staff if SI/HI or A/VH occurs and to consult with staff before acting on any harmful thoughts.  Will continue POC.

## 2015-07-24 NOTE — [facility] Group Notes (Signed)
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   Maurine SimmeringShugart, Adaora Mchaney M 07/24/2015, 10:14 AM

## 2015-07-25 DIAGNOSIS — F1210 Cannabis abuse, uncomplicated: Secondary | ICD-10-CM | POA: Clinically undetermined

## 2015-07-25 DIAGNOSIS — F323 Major depressive disorder, single episode, severe with psychotic features: Secondary | ICD-10-CM | POA: Clinically undetermined

## 2015-07-25 LAB — PROLACTIN: Prolactin: 52.9 ng/mL — ABNORMAL HIGH (ref 4.8–23.3)

## 2015-07-25 MED ORDER — NICOTINE 21 MG/24HR TD PT24: 21.0000 mg | MEDICATED_PATCH | Freq: Every day | TRANSDERMAL | Status: AC

## 2015-07-25 MED ORDER — ARIPIPRAZOLE 5 MG PO TABS: 5.0000 mg | ORAL_TABLET | Freq: Every day | ORAL | Status: AC

## 2015-07-25 MED ORDER — CITALOPRAM HYDROBROMIDE 10 MG PO TABS
5.0000 mg | ORAL_TABLET | Freq: Every day | ORAL | Status: DC
  Administered 2015-07-25 – 2015-07-26 (×2): 5 mg via ORAL
  Filled 2015-07-25 (×2): qty 1
  Filled 2015-07-25 (×2): qty 4

## 2015-07-25 MED ORDER — TRAZODONE HCL 50 MG PO TABS: 50.0000 mg | ORAL_TABLET | Freq: Every day | ORAL | Status: AC

## 2015-07-25 MED ORDER — HYDROXYZINE HCL 25 MG PO TABS: 25.0000 mg | ORAL_TABLET | ORAL | Status: AC | PRN

## 2015-07-25 MED ORDER — CITALOPRAM HYDROBROMIDE 10 MG PO TABS: 5.0000 mg | ORAL_TABLET | Freq: Every day | ORAL | Status: AC

## 2015-07-25 NOTE — Progress Notes (Signed)
Adult Psychoeducational Group Note  Date:  07/25/2015 Time:  9:15 PM  Group Topic/Focus:  Wrap-Up Group:   The focus of this group is to help patients review their daily goal of treatment and discuss progress on daily workbooks.  Participation Level:  Active  Participation Quality:  Appropriate  Affect:  Appropriate  Cognitive:  Appropriate  Insight: Appropriate  Engagement in Group:  Engaged  Modes of Intervention:  Discussion  Additional Comments: The patient expressed that she attended group and rates today a 7.The patient also said that group was about community and love for each other.  Octavio Mannshigpen, Matther Labell Lee 07/25/2015, 9:15 PM

## 2015-07-25 NOTE — [facility] Group Notes (Signed)
[facility] LCSW Group Therapy  07/25/2015 1:15 pm  Type of Therapy: Process Group Therapy  Participation Level:  Active  Participation Quality:  Appropriate  Affect:  Flat  Cognitive:  Oriented  Insight:  Improving  Engagement in Group:  Limited  Engagement in Therapy:  Limited  Modes of Intervention:  Activity, Clarification, Education, Problem-solving and Support  Summary of Progress/Problems: Today's group addressed the issue of overcoming obstacles.  Patients were asked to identify their biggest obstacle post d/c that stands in the way of their on-going success, and then problem solve as to how to manage this. Stayed the entire time, engaged throughout. Good insight.  Talked about turning negatives into positives by taking control of her own mood and interactions with others.  "People are always watching, and they see how you act.  A simple smile, a work of encouragement or support can make an unbelievable difference to someone, even if you don't realize it at the time.  I've experienced that here, I remind myself daily to try to be positive and pay attention."  Ida Rogueorth, Camari Quintanilla B 07/25/2015   4:05 PM

## 2015-07-25 NOTE — Progress Notes (Signed)
D: Priscilla Fisher has been polite, pleasant, and calm today. She was disappointed to learn she would not be discharged but expressed understanding. "I don't think people really like me," she said, however. "I'm ready to leave." She's been observed interacting well with peers, however, and does not seem to have engendered dislike from peers (at least that can be observed). She denies SI/HI/AVH. She has reported no side effects from medications.  A: Meds given as ordered. Q15 safety checks maintained. Support/encouragement offered. R: Pt remains free from harm and continues with treatment. Will continue to monitor for needs/safety.

## 2015-07-25 NOTE — Discharge Summary (Signed)
Physician Discharge Summary Note  Patient:  Priscilla Fisher is an 50 y.o., female MRN:  [id number] DOB:  Apr 16, 1965 Patient phone:  [phone] (home)  Patient address:   [address] [place] [address],  Total Time spent with patient: 30 minutes  Date of Admission:  07/24/2015 Date of Discharge: 07/25/2015  Reason for Admission:  Auditory halluciantions  Principal Problem: Severe recurrent major depressive disorder with psychotic features [facility]) Discharge Diagnoses: Patient Active Problem List   Diagnosis Date Noted  . Severe recurrent major depressive disorder with psychotic features (HCC) [F33.3] 07/24/2015  . Psychosis [F29] 07/23/2015    Past Psychiatric History: see HPI   Past Medical History: History reviewed. No pertinent past medical history. History reviewed. No pertinent past surgical history. Family History: History reviewed. No pertinent family history. Family Psychiatric  History: see HPI Social History:  History  Alcohol Use  . 1.2 oz/week  . 2 Cans of beer per week    Comment: occasional     History  Drug Use  . Yes  . Special: Marijuana    Social History   Social History  . Marital Status: Single    Spouse Name: N/A  . Number of Children: N/A  . Years of Education: N/A   Social History Main Topics  . Smoking status: Current Every Day Smoker -- 0.50 packs/day    Types: Cigarettes  . Smokeless tobacco: None  . Alcohol Use: 1.2 oz/week    2 Cans of beer per week     Comment: occasional  . Drug Use: Yes    Special: Marijuana  . Sexual Activity: Not Currently   Other Topics Concern  . None   Social History Narrative    Hospital Course:  Priscilla Fisher, a 50 year old African-American female was admitted to [facility] adult unit as a walk-in after being medically cleared at the [facility].  She reported having  auditory command hallucinations for 2 weeks.  She stated that her work stressed her out too much.    Priscilla Fisher was  admitted for Severe recurrent major depressive disorder with psychotic features [facility]) and crisis management.  She was treated with Abilify 5 mg mood psychosis and Celexa 10 mg depression.  Medical problems were identified and treated as needed.  Home medications were restarted as appropriate.  Improvement was monitored by observation and Priscilla Fisher daily report of symptom reduction.  Emotional and mental status was monitored by daily self inventory reports completed by Priscilla Fisher and clinical staff.  Patient reported continued improvement, denied any new concerns.  Patient had been compliant on medications and denied side effects.  Support and encouragement was provided.    At time of discharge, patient rated both depression and anxiety levels to be manageable and minimal.  Patient encouraged to attend groups to help with recognizing triggers of emotional crises and de-stabilizations.  Patient encouraged to attend group to help identify the positive things in life that would help in dealing with feelings of loss, depression and unhealthy or abusive tendencies.         Priscilla Fisher was evaluated by the treatment team for stability and plans for continued recovery upon discharge.  She was offered further treatment options upon discharge including Residential, Intensive Outpatient and Outpatient treatment.  She will follow up with agencies listed below for medication management and counseling.  Encouraged patient to maintain satisfactory support network and home environment.  Advised to adhere to medication compliance and outpatient  treatment follow up.      Priscilla Fisher motivation was an integral factor for scheduling further treatment.  Employment, transportation, bed availability, health status, family support, and any pending legal issues were also considered during her hospital stay.  Upon completion of this admission the patient was both mentally and medically stable for  discharge denying suicidal/homicidal ideation, auditory/visual/tactile hallucinations, delusional thoughts and paranoia.      Physical Findings: AIMS: Facial and Oral Movements Muscles of Facial Expression: None, normal Lips and Perioral Area: None, normal Jaw: None, normal Tongue: None, normal,Extremity Movements Upper (arms, wrists, hands, fingers): None, normal Lower (legs, knees, ankles, toes): None, normal, Trunk Movements Neck, shoulders, hips: None, normal, Overall Severity Severity of abnormal movements (highest score from questions above): None, normal Incapacitation due to abnormal movements: None, normal Patient's awareness of abnormal movements (rate only patient's report): No Awareness, Dental Status Current problems with teeth and/or dentures?: No Does patient usually wear dentures?: No  CIWA:    COWS:     Musculoskeletal: Strength & Muscle Tone: within normal limits Gait & Station: normal Patient leans: N/A  Psychiatric Specialty Exam:  SEE MD SRA Physical Exam  Vitals reviewed. Psychiatric: Her mood appears not anxious. Thought content is not paranoid. She does not exhibit a depressed mood. She expresses no homicidal and no suicidal ideation.    Review of Systems  All other systems reviewed and are negative.   Blood pressure 125/79, pulse 68, temperature 98.6 F (37 C), temperature source Oral, resp. rate 18, height 5\' 8"  (1.727 m), weight 60.328 kg (133 lb).Body mass index is 20.23 kg/(m^2).    Have you used any form of tobacco in the last 30 days? (Cigarettes, Smokeless Tobacco, Cigars, and/or Pipes): Yes  Has this patient used any form of tobacco in the last 30 days? (Cigarettes, Smokeless Tobacco, Cigars, and/or Pipes) Yes, Rx given  Blood Alcohol level:  Lab Results  Component Value Date   ETH <5 07/23/2015    Metabolic Disorder Labs:  No results found for: HGBA1C, MPG Lab Results  Component Value Date   PROLACTIN 52.9* 07/24/2015   Lab Results   Component Value Date   CHOL 200 07/24/2015   TRIG 176* 07/24/2015   HDL 64 07/24/2015   CHOLHDL 3.1 07/24/2015   VLDL 35 07/24/2015   LDLCALC 101* 07/24/2015    See Psychiatric Specialty Exam and Suicide Risk Assessment completed by Attending Physician prior to discharge.  Discharge destination:  Home  Is patient on multiple antipsychotic therapies at discharge:  No   Has Patient had three or more failed trials of antipsychotic monotherapy by history:  No  Recommended Plan for Multiple Antipsychotic Therapies: NA     Medication List    TAKE these medications      Indication   ARIPiprazole 5 MG tablet  Commonly known as:  ABILIFY  Take 1 tablet (5 mg total) by mouth at bedtime.   Indication:  Mood control     citalopram 10 MG tablet  Commonly known as:  CELEXA  Take 0.5 tablets (5 mg total) by mouth daily.   Indication:  Depression     hydrOXYzine 25 MG tablet  Commonly known as:  ATARAX/VISTARIL  Take 1 tablet (25 mg total) by mouth every 4 (four) hours as needed for anxiety (Sleep).   Indication:  Anxiety Neurosis, Anxiety     nicotine 21 mg/24hr patch  Commonly known as:  NICODERM CQ - dosed in mg/24 hours  Place 1 patch (21 mg  total) onto the skin daily.   Indication:  Nicotine Addiction     traZODone 50 MG tablet  Commonly known as:  DESYREL  Take 1 tablet (50 mg total) by mouth at bedtime.   Indication:  Trouble Sleeping         Follow-up recommendations:  Activity:  as tol Diet:  as tol  Comments:  1.  Take all your medications as prescribed.   2.  Report any adverse side effects to outpatient provider. 3.  Patient instructed to not use alcohol or illegal drugs while on prescription medicines. 4.  In the event of worsening symptoms, instructed patient to call 911, the crisis hotline or go to nearest emergency room for evaluation of symptoms.  Signed: Lindwood QuaSheila May Nadege Carriger, NP [facility] 07/25/2015, 10:58 AM

## 2015-07-25 NOTE — [facility] Suicide Risk Assessment (Signed)
[facility] INPATIENT:  Family/Significant Other Suicide Prevention Education  Suicide Prevention Education:  Contact Attempts: Priscilla Fisher, friend, [phone]  has been identified by the patient as the family member/significant other with whom the patient will be residing, and identified as the person(s) who will aid the patient in the event of a mental health crisis.  With written consent from the patient, two attempts were made to provide suicide prevention education, prior to and/or following the patient's discharge.  We were unsuccessful in providing suicide prevention education.  A suicide education pamphlet was given to the patient to share with family/significant other.  Date and time of first attempt:  07/25/2015 12:45 PM  Date and time of second attempt:  Was able to talk to Priscilla Fisher on second try.  She had encouraged Priscilla Fisher to come in, but was also quick to say that Priscilla Fisher is a very private person, and is only willing to divulge to others what she is comfortable with.  Daryel Geraldorth, Calleigh Lafontant B 07/25/2015, 12:44 PM

## 2015-07-25 NOTE — Progress Notes (Addendum)
[facility] MD Progress Note  07/25/2015 3:11 PM Priscilla Fisher  MRN:  [id number] Subjective:  Patient states " I was really overwhelmed by work.'  Objective:Patient presented with worsening depressive sx as well as anxiety , sleep issues and AH. Patient seen and chart reviewed.Discussed patient with treatment team.  Pt today reports continued sadness on and off as well as anxiety issues, inability to concentrate and AH. Pt is tolerating Abilify well, denies ADRs. Discussed adding an SSRI - she agrees with plan.      Principal Problem: MDD (major depressive disorder), single episode, severe with psychotic features (HCC) Diagnosis:   Patient Active Problem List   Diagnosis Date Noted  . Cannabis use disorder, mild, abuse [F12.10] 07/25/2015  . MDD (major depressive disorder), single episode, severe with psychotic features (HCC) [F32.3] 07/25/2015   Total Time spent with patient: 30 minutes  Past Psychiatric History: Please see H&P.   Past Medical History: Please see H&P. Family History: Please see H&P.  Family Psychiatric  History: Please see H&P.  Social History: Please see H&P.  History  Alcohol Use  . 1.2 oz/week  . 2 Cans of beer per week    Comment: occasional     History  Drug Use  . Yes  . Special: Marijuana    Social History   Social History  . Marital Status: Single    Spouse Name: N/A  . Number of Children: N/A  . Years of Education: N/A   Social History Main Topics  . Smoking status: Current Every Day Smoker -- 0.50 packs/day    Types: Cigarettes  . Smokeless tobacco: None  . Alcohol Use: 1.2 oz/week    2 Cans of beer per week     Comment: occasional  . Drug Use: Yes    Special: Marijuana  . Sexual Activity: Not Currently   Other Topics Concern  . None   Social History Narrative   Additional Social History:    History of alcohol / drug use?: Yes Name of Substance 1: Marijuana  1 - Age of First Use: Unspecified 1 - Amount (size/oz): uses  small amounts throughout the week to "unwind" 1 - Last Use / Amount: last use 2 weeks ago  Name of Substance 2: Social drinker of alcohol- no abuse specified                 Sleep: Fair  Appetite:  Fair  Current Medications: Current Facility-Administered Medications  Medication Dose Route Frequency Provider Last Rate Last Dose  . acetaminophen (TYLENOL) tablet 650 mg  650 mg Oral Q6H PRN Sanjuana Kava, NP      . alum & mag hydroxide-simeth (MAALOX/MYLANTA) 200-200-20 MG/5ML suspension 30 mL  30 mL Oral Q4H PRN Sanjuana Kava, NP      . ARIPiprazole (ABILIFY) tablet 5 mg  5 mg Oral QHS Sanjuana Kava, NP   5 mg at 07/24/15 2153  . citalopram (CELEXA) tablet 5 mg  5 mg Oral Daily Mikhaela Zaugg, MD   5 mg at 07/25/15 1102  . hydrOXYzine (ATARAX/VISTARIL) tablet 25 mg  25 mg Oral Q4H PRN Sanjuana Kava, NP      . magnesium hydroxide (MILK OF MAGNESIA) suspension 30 mL  30 mL Oral Daily PRN Sanjuana Kava, NP      . nicotine (NICODERM CQ - dosed in mg/24 hours) patch 21 mg  21 mg Transdermal Daily Sanjuana Kava, NP   21 mg at 07/24/15 1110  . traZODone (  DESYREL) tablet 50 mg  50 mg Oral QHS Sanjuana Kava, NP   50 mg at 07/24/15 0200    Lab Results:  Results for orders placed or performed during the hospital encounter of 07/24/15 (from the past 48 hour(s))  Lipid panel, fasting     Status: Abnormal   Collection Time: 07/24/15  6:26 AM  Result Value Ref Range   Cholesterol 200 0 - 200 mg/dL   Triglycerides 161 (H) <150 mg/dL   HDL 64 >09 mg/dL   Total CHOL/HDL Ratio 3.1 RATIO   VLDL 35 0 - 40 mg/dL   LDL Cholesterol 604 (H) 0 - 99 mg/dL    Comment:        Total Cholesterol/HDL:CHD Risk Coronary Heart Disease Risk Table                     Men   Women  1/2 Average Risk   3.4   3.3  Average Risk       5.0   4.4  2 X Average Risk   9.6   7.1  3 X Average Risk  23.4   11.0        Use the calculated Patient Ratio above and the CHD Risk Table to determine the patient's CHD Risk.         ATP III CLASSIFICATION (LDL):  <100     mg/dL   Optimal  540-981  mg/dL   Near or Above                    Optimal  130-159  mg/dL   Borderline  191-478  mg/dL   High  >295     mg/dL   Very High Performed at [facility]   TSH     Status: None   Collection Time: 07/24/15  6:26 AM  Result Value Ref Range   TSH 1.767 0.350 - 4.500 uIU/mL    Comment: Performed at [facility]  Prolactin     Status: Abnormal   Collection Time: 07/24/15  6:26 AM  Result Value Ref Range   Prolactin 52.9 (H) 4.8 - 23.3 ng/mL    Comment: (NOTE) Performed At: [facility] [address] [place], [place] [id number] Mila Homer MD QI:[id number] Performed at [facility]   Pregnancy, urine     Status: None   Collection Time: 07/24/15 11:45 AM  Result Value Ref Range   Preg Test, Ur NEGATIVE NEGATIVE    Comment:        THE SENSITIVITY OF THIS METHODOLOGY IS >20 mIU/mL. Performed at [facility]     Blood Alcohol level:  Lab Results  Component Value Date   [facility] <5 07/23/2015    Physical Findings: AIMS: Facial and Oral Movements Muscles of Facial Expression: None, normal Lips and Perioral Area: None, normal Jaw: None, normal Tongue: None, normal,Extremity Movements Upper (arms, wrists, hands, fingers): None, normal Lower (legs, knees, ankles, toes): None, normal, Trunk Movements Neck, shoulders, hips: None, normal, Overall Severity Severity of abnormal movements (highest score from questions above): None, normal Incapacitation due to abnormal movements: None, normal Patient's awareness of abnormal movements (rate only patient's report): No Awareness, Dental Status Current problems with teeth and/or dentures?: No Does patient usually wear dentures?: No  CIWA:    COWS:     Musculoskeletal: Strength & Muscle Tone: within normal limits Gait & Station: normal Patient leans: N/A  Psychiatric Specialty  Exam:  Physical Exam  Nursing note and vitals reviewed.   Review of Systems  Psychiatric/Behavioral: Positive for depression and substance abuse. The patient is nervous/anxious.   All other systems reviewed and are negative.   Blood pressure 125/79, pulse 68, temperature 98.6 F (37 C), temperature source Oral, resp. rate 18, height 5\' 8"  (1.727 m), weight 60.328 kg (133 lb).Body mass index is 20.23 kg/(m^2).  General Appearance: Guarded  Eye Contact:  Fair  Speech:  Normal Rate  Volume:  Normal  Mood:  Anxious and Depressed  Affect:  Congruent  Thought Process:  Goal Directed and Descriptions of Associations: Circumstantial  Orientation:  Full (Time, Place, and Person)  Thought Content:  Hallucinations: improving, Paranoid Ideation and Rumination  Suicidal Thoughts:  No  Homicidal Thoughts:  No  Memory:  Immediate;   Fair Recent;   Fair Remote;   Fair  Judgement:  Impaired  Insight:  Shallow  Psychomotor Activity:  Decreased  Concentration:  Concentration: Fair and Attention Span: Fair  Recall:  FiservFair  Fund of Knowledge:  Fair  Language:  Fair  Akathisia:  No  Handed:  Right  AIMS (if indicated):     Assets:  Desire for Improvement  ADL's:  Intact  Cognition:  WNL  Sleep:  Number of Hours: 5.75     Treatment Plan Summary:Patient with stressors at work , presented with mood sx as well as AH. Pt continues to progress. Discussed starting an SSRI for affective sx- she agrees. Will continue treatment. Daily contact with patient to assess and evaluate symptoms and progress in treatment and Medication management   Reviewed past medical records,treatment plan.  Will continue Abilify 5 mg po qhs for psychosis. Will add Celexa 5 mg po daily for affective sx. Will add Trazodone 50 mg po qhs for sleep. Will make available PRN medications as per agitation protocol. CSW will continue working on disposition. Pt to be referred to CBT. Recreational therapy consult. Patient to  participate in therapeutic milieu .       Jeslynn Hollander, MD 07/25/2015, 3:11 PM

## 2015-07-25 NOTE — Progress Notes (Signed)
[name] was in the dayroom when she began feeling as if something was "not right." She could not describe what was not right, but said she was not in pain of any sort. She said she felt as if she were going to hyperventilate. She appeared anxious and a bit panicky. Vitals 153/93, 71P, 100%RA, 18R. She asked to be alone, so she was shown to the quiet room. She was very focused on getting in touch with a particular person and asked if she could be allowed to leave to find this person. I did get her phone from her locked belongings (with charge RN and [facility]) so that she could get find a phone number to reach someone that might comfort her. I offered her Vistaril PRN and fluids, which she declined. She kept saying repeatedly that something was "not right" but she was polite and thoughtful throughout. She is now sitting on the pallet in the quiet room (charge RN aware) with the door open, reading the Bible. She has appeared to calm herself. Will continue to monitor for needs/safety.
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
## 2015-07-26 DIAGNOSIS — F1210 Cannabis abuse, uncomplicated: Secondary | ICD-10-CM

## 2015-07-26 NOTE — Progress Notes (Signed)
Pt concerned about the Celexa she took earlier. Pt stated she was feeling some type of way around 5 pm, pt stated she was feeling funny. Pt was very reluctant about taking any more medication. Writer was able to convince pt to take her Abilify this evening.

## 2015-07-26 NOTE — Progress Notes (Signed)
Pt discharged home in her own car. Pt was ambulatory, stable and appreciative at that time. All papers and prescriptions were given and valuables returned. Verbal understanding expressed. Denies SI/HI and A/VH. Pt given opportunity to express concerns and ask questions.  

## 2015-07-26 NOTE — Progress Notes (Signed)
  [facility] :  Will you be returning to the same living situation after discharge:  Yes,  home At discharge, do you have transportation home?: Yes,  car is here Do you have the ability to pay for your medications: Yes,  mental health  Release of information consent forms completed and in the chart;  Patient's signature needed at discharge.  Patient to Follow up at: Follow-up Information    [facility]   Specialty:  Behavioral Health   Why:  Go to the walk-in clinic M-F between 8 and 10AM for your hospital follow up appointment   Contact information:   [address] [place] [address] [phone]       Next level of care provider [facility]:yes  Safety Planning and Suicide Prevention discussed: Yes,  yes  Have you used any form of tobacco in the last 30 days? (Cigarettes, Smokeless Tobacco, Cigars, and/or Pipes): Yes  Has patient been referred to the Quitline?: Yes, faxed on 07/26/15  Patient has been referred for addiction treatment: Yes  Priscilla Fisher, Priscilla Fisher 07/26/2015, 9:26 AM

## 2015-07-26 NOTE — BHH Suicide Risk Assessment (Signed)
[facility]   Principal Problem: MDD (major depressive disorder), single episode, severe with psychotic features [facility](HCC) Discharge Diagnoses:  Patient Active Problem List   Diagnosis Date Noted  . Cannabis use disorder, mild, abuse [F12.10] 07/25/2015  . MDD (major depressive disorder), single episode, severe with psychotic features (HCC) [F32.3] 07/25/2015    Total Time spent with patient: 30 minutes  Musculoskeletal: Strength & Muscle Tone: within normal limits Gait & Station: normal Patient leans: N/A  Psychiatric Specialty Exam: Review of Systems  Psychiatric/Behavioral: Positive for substance abuse. Negative for depression, suicidal ideas and hallucinations.  All other systems reviewed and are negative.   Blood pressure 133/98, pulse 71, temperature 98.6 F (37 C), temperature source Oral, resp. rate 16, height 5\' 8"  (1.727 m), weight 60.328 kg (133 lb), SpO2 100 %.Body mass index is 20.23 kg/(m^2).  General Appearance: Fairly Groomed  Patent attorneyye Contact::  Fair  Speech:  Clear and Coherent409  Volume:  Normal  Mood:  Euthymic  Affect:  Congruent  Thought Process:  Goal Directed  Orientation:  Full (Time, Place, and Person)  Thought Content:  WDL  Suicidal Thoughts:  No  Homicidal Thoughts:  No  Memory:  Immediate;   Fair Recent;   Fair Remote;   Fair  Judgement:  Fair  Insight:  Fair  Psychomotor Activity:  Normal  Concentration:  Fair  Recall:  FiservFair  Fund of Knowledge:Fair  Language: Fair  Akathisia:  No  Handed:  Right  AIMS (if indicated):     Assets:  Communication Skills Desire for Improvement  Sleep:  Number of Hours: 5  Cognition: WNL  ADL's:  Intact   Mental Status Per Nursing Assessment::   On Admission:  Self-harm thoughts  Demographic Factors:  NA  Loss Factors: NA  Historical Factors: Impulsivity  Risk Reduction Factors:   Employed  Continued Clinical Symptoms:  Alcohol/Substance Abuse/Dependencies  Cognitive  Features That Contribute To Risk:  None    Suicide Risk:  Minimal: No identifiable suicidal ideation.  Patients presenting with no risk factors but with morbid ruminations; may be classified as minimal risk based on the severity of the depressive symptoms  Follow-up Information    [facility]   Specialty:  Behavioral Health   Why:  Go to the walk-in clinic M-F between 8 and 10AM for your hospital follow up appointment   Contact information:   [address] [place] [address] [phone]       Plan Of Care/Follow-up recommendations:  Activity:  no restrictions Diet:  regular Tests:  as needed Other:  follow up with aftercare  Jakeia Carreras, MD 07/26/2015, 8:45 AM

## 2015-07-26 NOTE — BHH Counselor (Signed)
PSA not completed.  Pt discharged within 50 hours of admission.

## 2015-08-03 LAB — HEMOGLOBIN A1C
Hgb A1c MFr Bld: 5.8 %
Mean Plasma Glucose: 120
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
## 2021-05-14 ENCOUNTER — Encounter (HOSPITAL_COMMUNITY): Payer: Self-pay

## 2021-05-14 ENCOUNTER — Emergency Department (HOSPITAL_COMMUNITY)
Admission: EM | Admit: 2021-05-14 | Discharge: 2021-05-14 | Disposition: A | Discharge status: Home / Self Care | Payer: Self-pay | Attending: Emergency Medicine | Admitting: Emergency Medicine

## 2021-05-14 ENCOUNTER — Other Ambulatory Visit: Payer: Self-pay

## 2021-05-14 DIAGNOSIS — S61215A Laceration without foreign body of left ring finger without damage to nail, initial encounter: Secondary | ICD-10-CM | POA: Insufficient documentation

## 2021-05-14 DIAGNOSIS — W268XXA Contact with other sharp object(s), not elsewhere classified, initial encounter: Secondary | ICD-10-CM | POA: Insufficient documentation

## 2021-05-14 DIAGNOSIS — Z23 Encounter for immunization: Secondary | ICD-10-CM | POA: Insufficient documentation

## 2021-05-14 MED ORDER — BACITRACIN ZINC 500 UNIT/GM EX OINT
TOPICAL_OINTMENT | Freq: Two times a day (BID) | CUTANEOUS | Status: DC
  Administered 2021-05-14: 1 via TOPICAL
  Filled 2021-05-14: qty 0.9

## 2021-05-14 MED ORDER — TETANUS-DIPHTH-ACELL PERTUSSIS 5-2.5-18.5 LF-MCG/0.5 IM SUSY
0.5000 mL | PREFILLED_SYRINGE | Freq: Once | INTRAMUSCULAR | Status: AC
  Administered 2021-05-14: 0.5 mL via INTRAMUSCULAR
  Filled 2021-05-14: qty 0.5

## 2021-05-14 NOTE — ED Triage Notes (Signed)
Patient arrived stating last night she was trying open something at work and cut her finger. Bleeding controlled. Unknown last tetanus  ?

## 2021-05-14 NOTE — Discharge Instructions (Signed)
Keep area Clean and dry.  May use over-the-counter Neosporin to the area. ?

## 2021-05-14 NOTE — ED Provider Notes (Signed)
?[facility]-EMERGENCY DEPT ?Provider Note ? ? ?CSN: [id number] ?Arrival date & time: 05/14/21  2055 ? ?  ? ?History ? ?Chief Complaint  ?Patient presents with  ? Laceration  ? ? ?Priscilla Fisher is a 56 y.o. female here for valuation of laceration to left ring finger while attempting to open package. Denies crush injury.  Laceration occurred Yesterday afternoon, greater than 24 hours PTA.  No redness, swelling, warmth.  She had to work today subsequently was not able to come for repair.  No bony tenderness.  No numbness, weakness, bleeding, redness or warmth.  Unsure last tetanus. ? ?HPI ? ?  ? ?Home Medications ?Prior to Admission medications   ?Medication Sig Start Date End Date Taking? Authorizing Provider  ?ARIPiprazole (ABILIFY) 5 MG tablet Take 1 tablet (5 mg total) by mouth at bedtime. 07/25/15   Adonis Brook, NP  ?citalopram (CELEXA) 10 MG tablet Take 0.5 tablets (5 mg total) by mouth daily. 07/25/15   Adonis Brook, NP  ?hydrOXYzine (ATARAX/VISTARIL) 25 MG tablet Take 1 tablet (25 mg total) by mouth every 4 (four) hours as needed for anxiety (Sleep). 07/25/15   Adonis Brook, NP  ?nicotine (NICODERM CQ - DOSED IN MG/24 HOURS) 21 mg/24hr patch Place 1 patch (21 mg total) onto the skin daily. 07/25/15   Adonis Brook, NP  ?traZODone (DESYREL) 50 MG tablet Take 1 tablet (50 mg total) by mouth at bedtime. 07/25/15   Adonis Brook, NP  ?   ? ?Allergies    ?Penicillins   ? ?Review of Systems   ?Review of Systems  ?Constitutional: Negative.   ?HENT: Negative.    ?Respiratory: Negative.    ?Cardiovascular: Negative.   ?Gastrointestinal: Negative.   ?Genitourinary: Negative.   ?Musculoskeletal: Negative.   ?Skin:  Positive for wound.  ?Neurological: Negative.   ?All other systems reviewed and are negative. ? ?Physical Exam ?Updated Vital Signs ?BP 124/73 (BP Location: Left Arm)   Pulse 76   Temp 98.1 ?F (36.7 ?C) (Oral)   Resp 16   SpO2 100%  ?Physical Exam ?Vitals and nursing note  reviewed.  ?Constitutional:   ?   General: She is not in acute distress. ?   Appearance: She is well-developed. She is not ill-appearing.  ?HENT:  ?   Head: Atraumatic.  ?Eyes:  ?   Pupils: Pupils are equal, round, and reactive to light.  ?Cardiovascular:  ?   Rate and Rhythm: Normal rate.  ?   Pulses: Normal pulses.     ?     Radial pulses are 2+ on the right side and 2+ on the left side.  ?Pulmonary:  ?   Effort: No respiratory distress.  ?Abdominal:  ?   General: There is no distension.  ?Musculoskeletal:     ?   General: Normal range of motion.  ?   Cervical back: Normal range of motion.  ?   Comments: No bony tenderness, full range of motion. Compartments soft  ?Skin: ?   General: Skin is warm and dry.  ?   Capillary Refill: Capillary refill takes less than 2 seconds.  ?   Comments: Dermal avulsion skin tear distal aspect left ring finger.  No nailbed injury.  No lacerations amenable to closure.  No active bleeding or drainage.  No redness or warmth  ?Neurological:  ?   General: No focal deficit present.  ?   Mental Status: She is alert.  ?Psychiatric:     ?   Mood and Affect:  Mood normal.  ? ? ?ED Results / Procedures / Treatments   ?Labs ?(all labs ordered are listed, but only abnormal results are displayed) ?Labs Reviewed - No data to display ? ?EKG ?None ? ?Radiology ?No results found. ? ?Procedures ?Wound repair ? ?Date/Time: 05/14/2021 10:14 PM ?Performed by: Ralph Leyden A, PA-C ?Authorized by: Linwood Dibbles, PA-C  ?Consent: Verbal consent obtained. Written consent not obtained. ?Consent given by: patient ?Patient understanding: patient states understanding of the procedure being performed ?Patient consent: the patient's understanding of the procedure matches consent given ?Procedure consent: procedure consent matches procedure scheduled ?Relevant documents: relevant documents present and verified ?Test results: test results available and properly labeled ?Site marked: the operative site was  marked ?Imaging studies: imaging studies available ?Required items: required blood products, implants, devices, and special equipment available ?Patient identity confirmed: verbally with patient ?Time out: Immediately prior to procedure a "time out" was called to verify the correct patient, procedure, equipment, support staff and site/side marked as required. ?Preparation: Patient was prepped and draped in the usual sterile fashion. ?Local anesthesia used: no ? ?Anesthesia: ?Local anesthesia used: no ? ?Sedation: ?Patient sedated: no ? ?Patient tolerance: patient tolerated the procedure well with no immediate complications ? ?  ? ? ?Medications Ordered in ED ?Medications  ?bacitracin ointment (1 application. Topical Given 05/14/21 2158)  ?Tdap (BOOSTRIX) injection 0.5 mL (0.5 mLs Intramuscular Given 05/14/21 2157)  ? ? ?ED Course/ Medical Decision Making/ A&P ?  ? ?70 here for evaluation of laceration to the distal aspect left ring finger.  She is neurovascularly intact.  Laceration occurred greater than 24 hours PTA.  Tetanus updated here.  No bony tenderness.  No nailbed injury.  Patient does have a 5 mm x 4 mm dermal avulsion skin tear to distal aspect of finger.  No pulp exposed.  She has full range of motion.  Given timing, area no need for laceration repair at this time.  Wound was cleaned, bacitracin applied.  Discussed anticipatory guidance.  We will have her follow-up outpatient.  She is agreeable. ? ?The patient has been appropriately medically screened and/or stabilized in the ED. I have low suspicion for any other emergent medical condition which would require further screening, evaluation or treatment in the ED or require inpatient management. ? ?Patient is hemodynamically stable and in no acute distress.  Patient able to ambulate in department prior to ED.  Evaluation does not show acute pathology that would require ongoing or additional emergent interventions while in the emergency department or further  inpatient treatment.  I have discussed the diagnosis with the patient and answered all questions.  Pain is been managed while in the emergency department and patient has no further complaints prior to discharge.  Patient is comfortable with plan discussed in room and is stable for discharge at this time.  I have discussed strict return precautions for returning to the emergency department.  Patient was encouraged to follow-up with PCP/specialist refer to at discharge.  ? ?                        ?Medical Decision Making ?Amount and/or Complexity of Data Reviewed ?External Data Reviewed: labs, radiology and notes. ? ?Risk ?OTC drugs. ?Prescription drug management. ?Drug therapy requiring intensive monitoring for toxicity. ?Diagnosis or treatment significantly limited by social determinants of health. ? ? ? ? ? ? ? ? ?Final Clinical Impression(s) / ED Diagnoses ?Final diagnoses:  ?Laceration of left ring finger without  foreign body without damage to nail, initial encounter  ? ? ?Rx / DC Orders ?ED Discharge Orders   ? ? None  ? ?  ? ? ?  ?Gayla Benn A, PA-C ?05/14/21 2214 ? ?  ?Mancel BaleWentz, Elliott, MD ?05/14/21 2308 ? ?

## 2021-05-14 NOTE — ED Notes (Signed)
Wound cleaned.

## 2022-01-07 ENCOUNTER — Other Ambulatory Visit: Payer: Self-pay

## 2022-01-07 ENCOUNTER — Emergency Department (HOSPITAL_COMMUNITY)
Admission: EM | Admit: 2022-01-07 | Discharge: 2022-01-07 | Disposition: A | Discharge status: Home / Self Care | Payer: Self-pay | Attending: Emergency Medicine | Admitting: Emergency Medicine

## 2022-01-07 DIAGNOSIS — R21 Rash and other nonspecific skin eruption: Secondary | ICD-10-CM | POA: Insufficient documentation

## 2022-01-07 MED ORDER — TRIAMCINOLONE ACETONIDE 0.1 % EX CREA: 1.0000 | TOPICAL_CREAM | Freq: Two times a day (BID) | CUTANEOUS | 0 refills | Status: AC

## 2022-01-07 NOTE — Discharge Instructions (Signed)
Use Simple cleanser. Can dilute the triamcinolone and apply very sparingly to face.

## 2022-01-07 NOTE — ED Triage Notes (Signed)
Pt reports rash to face since thanksgiving.

## 2022-01-07 NOTE — ED Provider Notes (Signed)
[facility]-EMERGENCY DEPT Provider Note   CSN: [id number] Arrival date & time: 01/07/22  1730     History  Chief Complaint  Patient presents with   Allergic Reaction    Priscilla Fisher is a 56 y.o. female.  56 yo female with complaint of rash to the face onset 3 days ago. No relief with poison ivy cream, cortisone 10, benadryl. Has been doing yard work on the house, unknown if contact irritant. No other rashes to body. No new soaps/lotions/detergents. No respiratory or GI symptoms.        Home Medications Prior to Admission medications   Medication Sig Start Date End Date Taking? Authorizing Provider  triamcinolone cream (KENALOG) 0.1 % Apply 1 Application topically 2 (two) times daily. 01/07/22  Yes Jeannie Fend, PA-C  ARIPiprazole (ABILIFY) 5 MG tablet Take 1 tablet (5 mg total) by mouth at bedtime. 07/25/15   Adonis Brook, NP  citalopram (CELEXA) 10 MG tablet Take 0.5 tablets (5 mg total) by mouth daily. 07/25/15   Adonis Brook, NP  hydrOXYzine (ATARAX/VISTARIL) 25 MG tablet Take 1 tablet (25 mg total) by mouth every 4 (four) hours as needed for anxiety (Sleep). 07/25/15   Adonis Brook, NP  nicotine (NICODERM CQ - DOSED IN MG/24 HOURS) 21 mg/24hr patch Place 1 patch (21 mg total) onto the skin daily. 07/25/15   Adonis Brook, NP  traZODone (DESYREL) 50 MG tablet Take 1 tablet (50 mg total) by mouth at bedtime. 07/25/15   Adonis Brook, NP      Allergies    Penicillins    Review of Systems   Review of Systems Negative except as per HPI Physical Exam Updated Vital Signs BP (!) 136/96 (BP Location: Left Arm)   Pulse 75   Temp 98.3 F (36.8 C) (Oral)   Resp 16   SpO2 98%  Physical Exam Vitals and nursing note reviewed.  Constitutional:      General: She is not in acute distress.    Appearance: She is well-developed. She is not diaphoretic.  HENT:     Head: Normocephalic and atraumatic.  Pulmonary:     Effort: Pulmonary effort is  normal.  Skin:    Findings: Rash present.     Comments: Fine rash around face only, no evidence of secondary infection.   Neurological:     Mental Status: She is alert and oriented to person, place, and time.  Psychiatric:        Behavior: Behavior normal.     ED Results / Procedures / Treatments   Labs (all labs ordered are listed, but only abnormal results are displayed) Labs Reviewed - No data to display  EKG None  Radiology No results found.  Procedures Procedures    Medications Ordered in ED Medications - No data to display  ED Course/ Medical Decision Making/ A&P                           Medical Decision Making Risk Prescription drug management.   56 year old female with complaint of rash to the face x3 days as above.  She is found to have a fine papular rash around her face without evidence of secondary infection.  Question contact irritant versus reaction to the multiple creams she has tried to treat the rash with.  Encouraged to discontinue these creams.  Recommend simple face cleanser and moisturizer.  Can add a very small amount of triamcinolone diluted in moisturizer if  needed.  Discussed concerns for using steroids on the face including thinning of skin and loss of pigmentation.  Recheck with PCP if symptoms persist.        Final Clinical Impression(s) / ED Diagnoses Final diagnoses:  Rash    Rx / DC Orders ED Discharge Orders          Ordered    triamcinolone cream (KENALOG) 0.1 %  2 times daily        01/07/22 1822              Jeannie Fend, PA-C 01/07/22 1900    Lorre Nick, MD 01/14/22 [phone]
